# Patient Record
Sex: Female | Born: 1935 | Race: White | Hispanic: No | Smoking: Never smoker
Health system: Southern US, Community
[De-identification: ages and names within clinical notes are randomized; demographics above are authoritative.]

## PROBLEM LIST (undated history)

## (undated) DIAGNOSIS — I10 Essential (primary) hypertension: Secondary | ICD-10-CM

## (undated) DIAGNOSIS — E119 Type 2 diabetes mellitus without complications: Secondary | ICD-10-CM

## (undated) DIAGNOSIS — N289 Disorder of kidney and ureter, unspecified: Secondary | ICD-10-CM

---

## 2007-03-21 ENCOUNTER — Ambulatory Visit: Payer: Self-pay | Admitting: Emergency Medicine

## 2010-03-09 ENCOUNTER — Observation Stay: Payer: Self-pay | Admitting: Internal Medicine

## 2010-06-13 ENCOUNTER — Ambulatory Visit: Payer: Self-pay | Admitting: Internal Medicine

## 2015-06-21 ENCOUNTER — Ambulatory Visit (INDEPENDENT_AMBULATORY_CARE_PROVIDER_SITE_OTHER): Payer: Medicare Other

## 2015-06-21 ENCOUNTER — Ambulatory Visit
Admission: EM | Admit: 2015-06-21 | Discharge: 2015-06-21 | Disposition: A | Discharge status: Home / Self Care | Payer: Medicare Other | Attending: Family Medicine | Admitting: Family Medicine

## 2015-06-21 ENCOUNTER — Encounter: Payer: Self-pay | Admitting: Emergency Medicine

## 2015-06-21 DIAGNOSIS — S2232XA Fracture of one rib, left side, initial encounter for closed fracture: Secondary | ICD-10-CM

## 2015-06-21 HISTORY — DX: Essential (primary) hypertension: I10

## 2015-06-21 HISTORY — DX: Disorder of kidney and ureter, unspecified: N28.9

## 2015-06-21 HISTORY — DX: Type 2 diabetes mellitus without complications: E11.9

## 2015-06-21 MED ORDER — HYDROCODONE-ACETAMINOPHEN 5-325 MG PO TABS: 1.0000 | ORAL_TABLET | Freq: Four times a day (QID) | ORAL | Status: DC | PRN

## 2015-06-21 NOTE — ED Notes (Signed)
Patient states she fell onto a trash can early Tuesday morning and injured left side. Is still having pain

## 2015-06-21 NOTE — Discharge Instructions (Signed)

## 2015-06-21 NOTE — ED Provider Notes (Signed)
CSN: [id number]     Arrival date & time 06/21/15  [id number] History   First MD Initiated Contact with Patient 06/21/15 1036     Chief Complaint  Patient presents with  . Fall   (Consider location/radiation/quality/duration/timing/severity/associated sxs/prior Treatment) HPI  This 82 year old female who was sitting on the toilet 2 days ago as she leaned forward to stand up she lost her balance and fell sideways and landed on a trash can striking her left lateral ribs. Since that time she has noticed some bruising on the rib has been very sore particular with any motion or movement. She states that coughing or straining is also very painful. The patient is on hemodialysis since she's had surgery on her left kidney for cancer having undergone a nephrectomy. Is scheduled for dialysis today.        Past Medical History  Diagnosis Date  . Diabetes mellitus without complication (HCC)   . Renal disorder   . Hypertension    History reviewed. No pertinent past surgical history. History reviewed. No pertinent family history. Social History  Substance Use Topics  . Smoking status: Never Smoker   . Smokeless tobacco: None  . Alcohol Use: No   OB History    No data available     Review of Systems  Constitutional: Positive for activity change. Negative for fever, chills and fatigue.  Respiratory: Positive for shortness of breath. Negative for cough.   All other systems reviewed and are negative.   Allergies  Buspirone; Gabapentin; Lisinopril; Lorazepam; Morphine and related; Percocet; and Sulfa antibiotics  Home Medications   Prior to Admission medications   Medication Sig Start Date End Date Taking? Authorizing Provider  citalopram (CELEXA) 20 MG tablet Take 20 mg by mouth daily.   Yes Historical Provider, MD  diazepam (VALIUM) 2 MG tablet Take 2 mg by mouth every 6 (six) hours as needed for anxiety.   Yes Historical Provider, MD  fluticasone (FLONASE) 50 MCG/ACT nasal spray Place into  both nostrils daily.   Yes Historical Provider, MD  folic acid-vitamin b complex-vitamin c-selenium-zinc (DIALYVITE) 3 MG TABS tablet Take 1 tablet by mouth daily.   Yes Historical Provider, MD  insulin glargine (LANTUS) 100 UNIT/ML injection Inject into the skin at bedtime.   Yes Historical Provider, MD  insulin lispro (HUMALOG) 100 UNIT/ML injection Inject into the skin 3 (three) times daily before meals.   Yes Historical Provider, MD  ketoconazole (NIZORAL) 2 % cream Apply 1 application topically daily.   Yes Historical Provider, MD  losartan (COZAAR) 25 MG tablet Take 25 mg by mouth daily.   Yes Historical Provider, MD  nystatin cream (MYCOSTATIN) Apply 1 application topically 2 (two) times daily.   Yes Historical Provider, MD  pantoprazole (PROTONIX) 40 MG tablet Take 40 mg by mouth daily.   Yes Historical Provider, MD  pregabalin (LYRICA) 100 MG capsule Take 100 mg by mouth 2 (two) times daily.   Yes Historical Provider, MD  promethazine (PHENERGAN) 25 MG tablet Take 25 mg by mouth every 6 (six) hours as needed for nausea or vomiting.   Yes Historical Provider, MD  rosuvastatin (CRESTOR) 10 MG tablet Take 10 mg by mouth daily.   Yes Historical Provider, MD  zolpidem (AMBIEN) 5 MG tablet Take 5 mg by mouth at bedtime as needed for sleep.   Yes Historical Provider, MD  HYDROcodone-acetaminophen (NORCO/VICODIN) 5-325 MG tablet Take 1 tablet by mouth every 6 (six) hours as needed. 06/21/15   Lutricia FeilWilliam P Ahava Kissoon, PA-C  Meds Ordered and Administered this Visit  Medications - No data to display  BP 149/65 mmHg  Pulse 79  Temp(Src) 97.8 F (36.6 C) (Oral)  Resp 22  Ht  (1.676 m)  Wt 240 lb (108.863 kg)  BMI 38.76 kg/m2  SpO2 96% No data found.   Physical Exam  Constitutional: She is oriented to person, place, and time. She appears well-developed and well-nourished. No distress.  HENT:  Head: Normocephalic and atraumatic.  Eyes: Conjunctivae are normal. Pupils are equal, round, and  reactive to light.  Neck: Normal range of motion. Neck supple.  Pulmonary/Chest: Effort normal and breath sounds normal.  Musculoskeletal: Normal range of motion. She exhibits no edema or tenderness.  Neurological: She is alert and oriented to person, place, and time.  Skin: Skin is warm and dry. She is not diaphoretic.  Psychiatric: She has a normal mood and affect. Her behavior is normal. Judgment and thought content normal.  Nursing note and vitals reviewed.   ED Course  Procedures (including critical care time)  Labs Review Labs Reviewed - No data to display  Imaging Review Dg Ribs Unilateral W/chest Left  06/21/2015  CLINICAL DATA:  Larey Seat off toilet 3 days ago, left posterolateral rib pain EXAM: LEFT RIBS AND CHEST - 3+ VIEW COMPARISON:  06/13/2010 FINDINGS: Five views left ribs submitted. No infiltrate or pulmonary edema. There is mild displaced fracture of the left seventh rib. No pneumothorax. Sutures and surgical clips are noted in left upper abdomen. IMPRESSION: Mild displaced fracture of the left seventh rib.  No pneumothorax. Electronically Signed   By: Natasha Mead M.D.   On: 06/21/2015 11:53     Visual Acuity Review  Right Eye Distance:   Left Eye Distance:   Bilateral Distance:    Right Eye Near:   Left Eye Near:    Bilateral Near:         MDM   1. Fractured rib, left, closed, initial encounter    Discharge Medication List as of 06/21/2015 12:07 PM    START taking these medications   Details  HYDROcodone-acetaminophen (NORCO/VICODIN) 5-325 MG tablet Take 1 tablet by mouth every 6 (six) hours as needed., Starting 06/21/2015, Until Discontinued, Print      Plan: 1. Test/x-ray results and diagnosis reviewed with patient 2. rx as per orders; risks, benefits, potential side effects reviewed with patient 3. Recommend supportive treatment with Filling and deep breathing or use of an inspirometer that she may have at home. She will follow-up with her primary care  physician in the very near future. She is not allowed to take nonsteroidal anti-inflammatory drugs due to her previous nephrectomy. Therefore I have asked her to rely heavily on Tylenol and use the Vicodin only when her pain is very severe. 4. F/u prn if symptoms worsen or don't improve      Lutricia Feil, PA-C 06/21/15 1215

## 2015-11-13 ENCOUNTER — Encounter: Payer: Self-pay | Admitting: Emergency Medicine

## 2015-11-13 ENCOUNTER — Ambulatory Visit
Admission: EM | Admit: 2015-11-13 | Discharge: 2015-11-13 | Disposition: A | Discharge status: Home / Self Care | Payer: Medicare Other | Attending: Family Medicine | Admitting: Family Medicine

## 2015-11-13 ENCOUNTER — Ambulatory Visit (INDEPENDENT_AMBULATORY_CARE_PROVIDER_SITE_OTHER): Payer: Medicare Other

## 2015-11-13 DIAGNOSIS — M19072 Primary osteoarthritis, left ankle and foot: Secondary | ICD-10-CM

## 2015-11-13 MED ORDER — OXYCODONE-ACETAMINOPHEN 5-325 MG PO TABS: 1.0000 | ORAL_TABLET | Freq: Three times a day (TID) | ORAL | 0 refills | Status: AC | PRN

## 2015-11-13 NOTE — ED Provider Notes (Signed)
CSN: [id number]     Arrival date & time 11/13/15  1441 History   First MD Initiated Contact with Patient 11/13/15 1525     Chief Complaint  Patient presents with  . Leg Swelling    left foot   (Consider location/radiation/quality/duration/timing/severity/associated sxs/prior Treatment) HPI  Same-year-old female who presents with a week history of left foot and ankle pain. She states that today she has had the pain and swelling for the last 5 days. He was placed initially on colchicine for possible gout but is not improved at all. At dialysis today they became certain regarding the pain and swelling and were wondering if she may have had a stress fracture so she was sent here. States that she is having difficulty walking on her foot. He does not remember any specific injury but hasn't been on her feet more than usual.       Past Medical History:  Diagnosis Date  . Diabetes mellitus without complication (HCC)   . Hypertension   . Renal disorder    History reviewed. No pertinent surgical history. History reviewed. No pertinent family history. Social History  Substance Use Topics  . Smoking status: Never Smoker  . Smokeless tobacco: Never Used  . Alcohol use No   OB History    No data available     Review of Systems  Constitutional: Positive for activity change. Negative for chills, fatigue and fever.  Musculoskeletal: Positive for gait problem and joint swelling.  All other systems reviewed and are negative.   Allergies  Buspirone; Gabapentin; Lisinopril; Lorazepam; Morphine and related; Percocet [oxycodone-acetaminophen]; and Sulfa antibiotics  Home Medications   Prior to Admission medications   Medication Sig Start Date End Date Taking? Authorizing Provider  citalopram (CELEXA) 20 MG tablet Take 20 mg by mouth daily.    Historical Provider, MD  diazepam (VALIUM) 2 MG tablet Take 2 mg by mouth every 6 (six) hours as needed for anxiety.    Historical Provider, MD   fluticasone (FLONASE) 50 MCG/ACT nasal spray Place into both nostrils daily.    Historical Provider, MD  folic acid-vitamin b complex-vitamin c-selenium-zinc (DIALYVITE) 3 MG TABS tablet Take 1 tablet by mouth daily.    Historical Provider, MD  insulin glargine (LANTUS) 100 UNIT/ML injection Inject into the skin at bedtime.    Historical Provider, MD  insulin lispro (HUMALOG) 100 UNIT/ML injection Inject into the skin 3 (three) times daily before meals.    Historical Provider, MD  ketoconazole (NIZORAL) 2 % cream Apply 1 application topically daily.    Historical Provider, MD  losartan (COZAAR) 25 MG tablet Take 25 mg by mouth daily.    Historical Provider, MD  nystatin cream (MYCOSTATIN) Apply 1 application topically 2 (two) times daily.    Historical Provider, MD  oxyCODONE-acetaminophen (PERCOCET/ROXICET) 5-325 MG tablet Take 1 tablet by mouth every 8 (eight) hours as needed for severe pain. 11/13/15   Lutricia Feil, PA-C  pantoprazole (PROTONIX) 40 MG tablet Take 40 mg by mouth daily.    Historical Provider, MD  pregabalin (LYRICA) 100 MG capsule Take 100 mg by mouth 2 (two) times daily.    Historical Provider, MD  promethazine (PHENERGAN) 25 MG tablet Take 25 mg by mouth every 6 (six) hours as needed for nausea or vomiting.    Historical Provider, MD  rosuvastatin (CRESTOR) 10 MG tablet Take 10 mg by mouth daily.    Historical Provider, MD  zolpidem (AMBIEN) 5 MG tablet Take 5 mg by mouth at  bedtime as needed for sleep.    Historical Provider, MD   Meds Ordered and Administered this Visit  Medications - No data to display  BP (!) 126/55 (BP Location: Right Arm)   Pulse 77   Temp 98 F (36.7 C) (Oral)   Resp 18   Ht 5\' 6"  (1.676 m)   Wt 237 lb (107.5 kg)   SpO2 97%   BMI 38.25 kg/m  No data found.   Physical Exam  Constitutional: She is oriented to person, place, and time. She appears well-developed and well-nourished. No distress.  HENT:  Head: Normocephalic and atraumatic.   Eyes: EOM are normal. Pupils are equal, round, and reactive to light.  Neck: Normal range of motion. Neck supple.  Musculoskeletal: Normal range of motion. She exhibits edema and tenderness. She exhibits no deformity.  Examination of the left foot and ankle shows  warmth and swelling diffusely of the left foot and ankle. Maximum tenderness is along the second metatarsal at the midshaft portion of the foot and also of the lateral malleolus the ankle. She has fairly good range of motion of the ankle and subtalar joint.  Neurological: She is alert and oriented to person, place, and time.  Skin: Skin is warm and dry. She is not diaphoretic. There is erythema.  Psychiatric: She has a normal mood and affect. Her behavior is normal. Judgment and thought content normal.  Nursing note and vitals reviewed.   Urgent Care Course   Clinical Course    Procedures (including critical care time)  Labs Review Labs Reviewed - No data to display  Imaging Review Dg Ankle Complete Left  Result Date: 11/13/2015 CLINICAL DATA:  Left foot pain for 5 days. EXAM: LEFT ANKLE COMPLETE - 3+ VIEW COMPARISON:  None. FINDINGS: There is no evidence of fracture, dislocation, or joint effusion. There is no evidence of arthropathy or other focal bone abnormality. Soft tissues are unremarkable. IMPRESSION: Normal left ankle. Electronically Signed   By: Lupita RaiderJames  Green Jr, M.D.   On: 11/13/2015 16:14   Dg Foot Complete Left  Result Date: 11/13/2015 CLINICAL DATA:  Left foot pain and swelling for 5 days. EXAM: LEFT FOOT - COMPLETE 3+ VIEW COMPARISON:  None. FINDINGS: No fracture or dislocation is noted. Moderate spurring of posterior calcaneus is noted. Degenerative changes seen involving the intertarsal and tarsal metatarsal joints. No definite soft tissue abnormality is noted. IMPRESSION: Degenerative changes as described above. No acute abnormality seen in the left foot. Electronically Signed   By: Lupita RaiderJames  Green Jr, M.D.   On:  11/13/2015 16:09     Visual Acuity Review  Right Eye Distance:   Left Eye Distance:   Bilateral Distance:    Right Eye Near:   Left Eye Near:    Bilateral Near:     Patient was given a boot orthosis for her left foot    MDM   1. Degenerative joint disease of ankle and foot, left    New Prescriptions   OXYCODONE-ACETAMINOPHEN (PERCOCET/ROXICET) 5-325 MG TABLET    Take 1 tablet by mouth every 8 (eight) hours as needed for severe pain.  Plan: 1. Test/x-ray results and diagnosis reviewed with patient 2. rx as per orders; risks, benefits, potential side effects reviewed with patient 3. Recommend supportive treatment with Elevation and ice as necessary. Patient is on dialysis and is not permitted to take nostril anti-inflammatory medications nor prednisone. I will give her a boot orthosis to help with her ambulation. She needs to follow-up with  Dr. Ether GriffinsFowler a podiatrist for further care treatment. I did give her 5 Percocets because of her pain and inability take any other medication. She requires any further medication she should 5 contact her primary care physician. 4. F/u prn if symptoms worsen or don't improve     Lutricia FeilWilliam P Yomar Mejorado, PA-C 11/13/15 1705

## 2015-11-13 NOTE — ED Triage Notes (Signed)
Patient has had pain and swelling in her left foot for the past 5 days.  Patient was put on colchicine for possible gout.  Patient was told to come here during her dialysis today for possible stress fracture.

## 2018-02-18 IMAGING — CR DG FOOT COMPLETE 3+V*L*
3 series · 3 of 3 positions shown · non-contrast
Comparison: None.

CLINICAL DATA: Left foot pain and swelling for 5 days.

EXAM:
LEFT FOOT - COMPLETE 3+ VIEW

[foot ap]
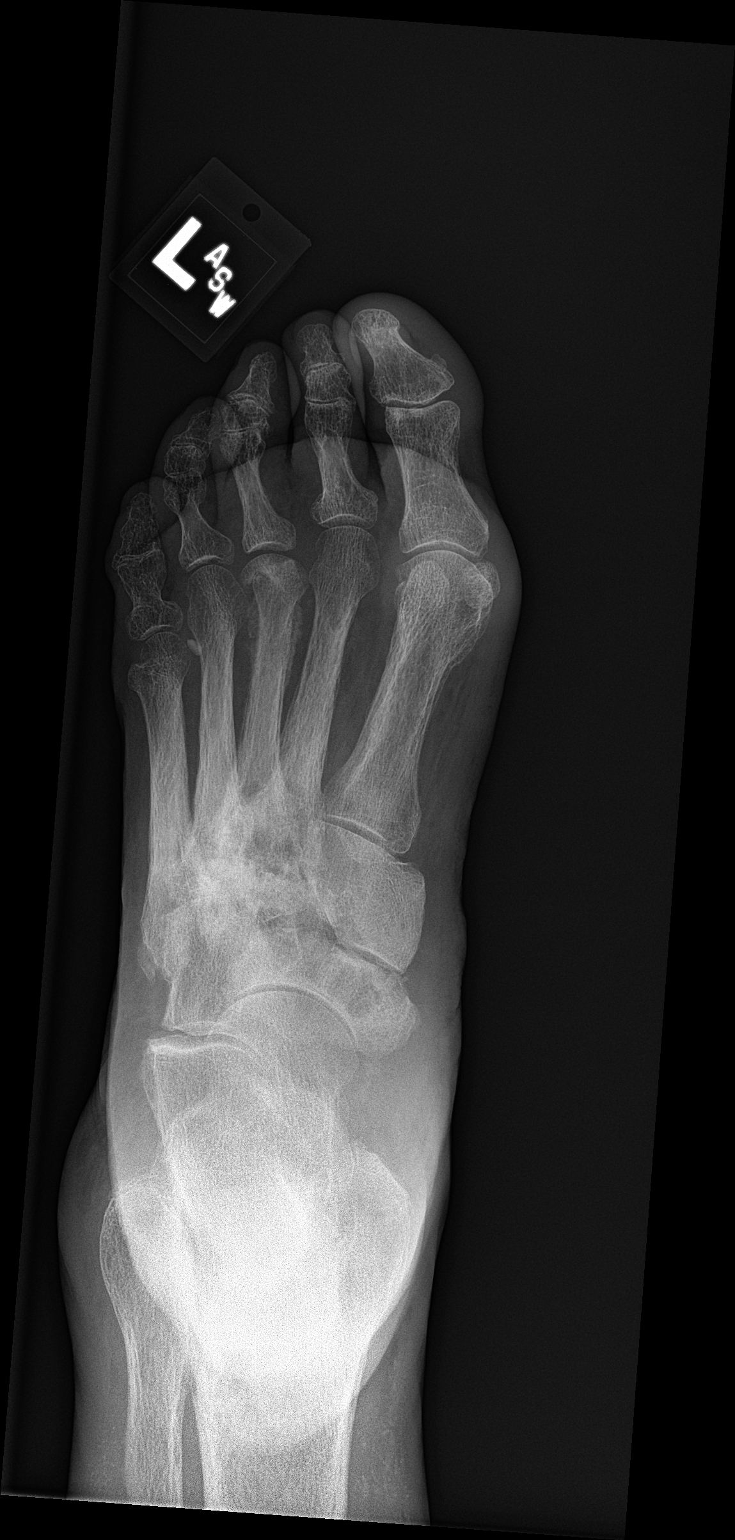

[foot obl]
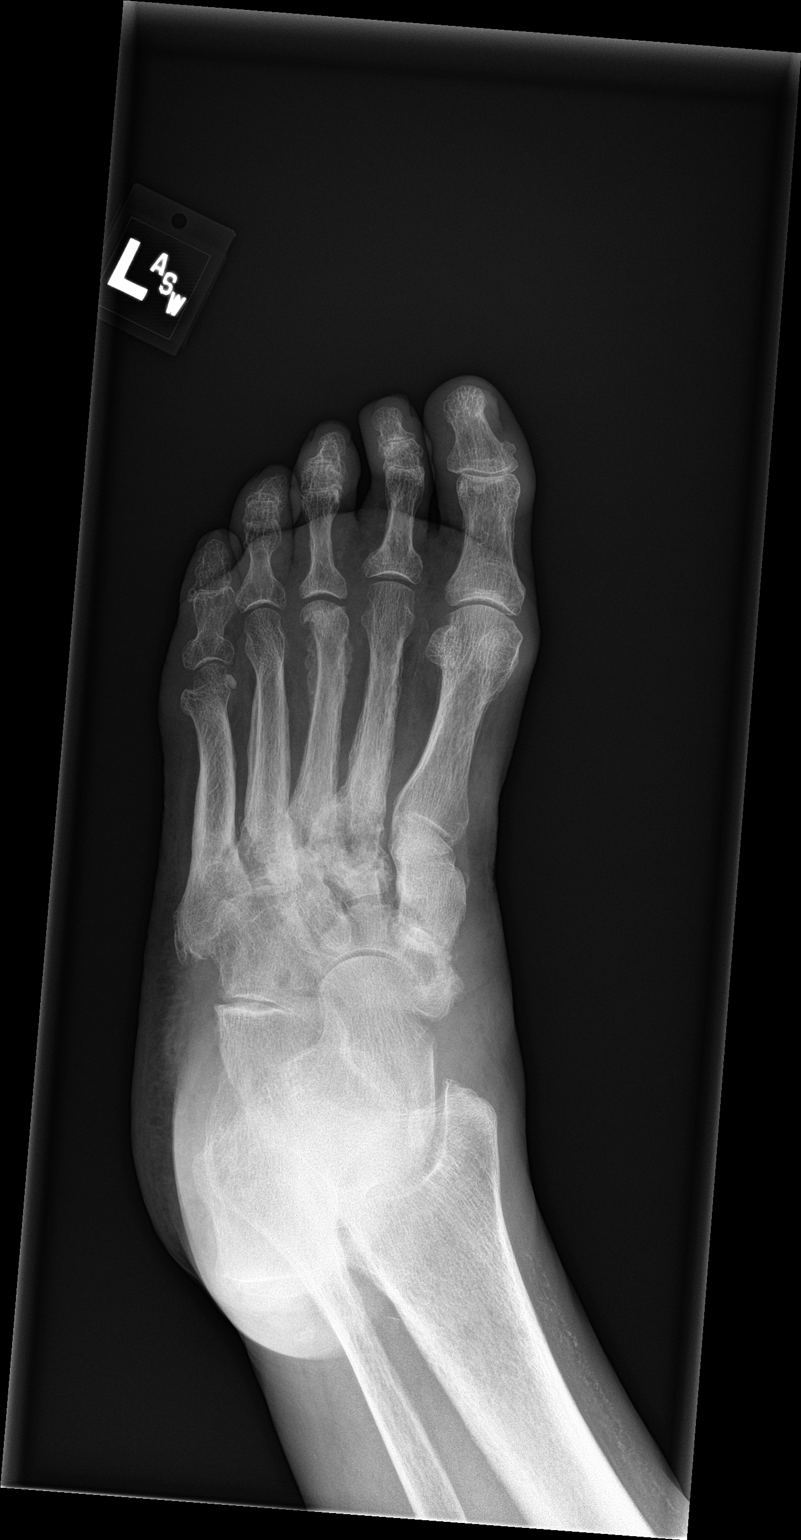

[foot lat]
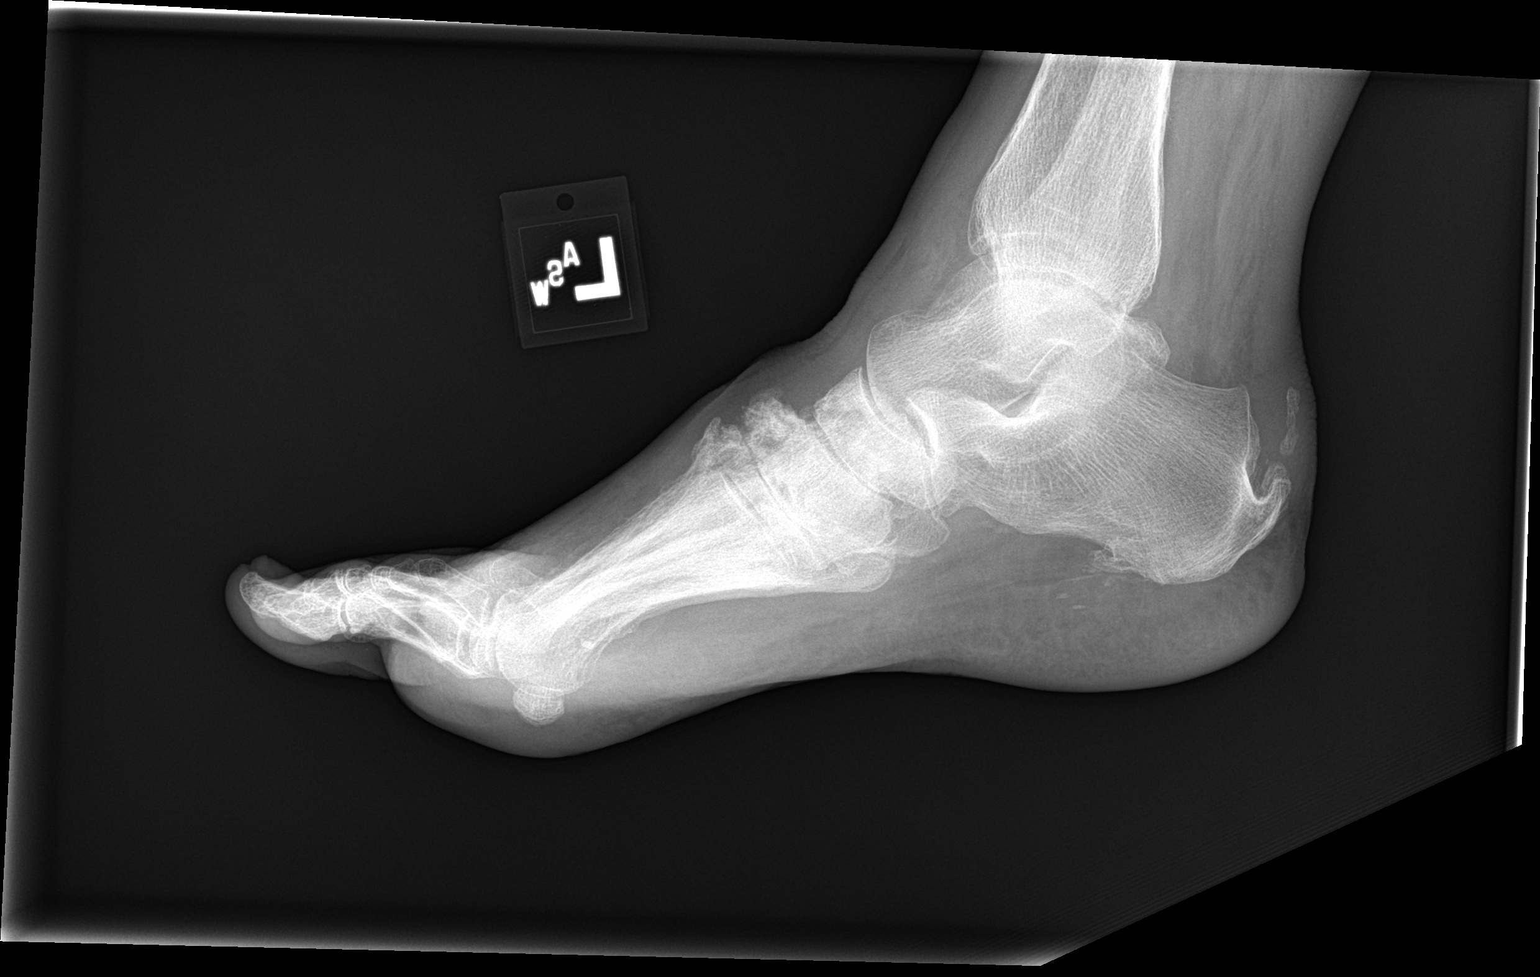

[3 of 3 positions shown; findings below may reference images not displayed]

FINDINGS: No fracture or dislocation is noted. Moderate spurring of posterior
calcaneus is noted. Degenerative changes seen involving the
intertarsal and tarsal metatarsal joints. No definite soft tissue
abnormality is noted.
IMPRESSION: Degenerative changes as described above. No acute abnormality seen
in the left foot.

## 2018-03-24 DEATH — deceased
